# Patient Record
Sex: Female | Born: 1980 | Race: Black or African American | Hispanic: No | Marital: Married
Health system: Southern US, Community
[De-identification: ages and names within clinical notes are randomized; demographics above are authoritative.]

---

## 2005-07-21 ENCOUNTER — Encounter: Admission: RE | Admit: 2005-07-21 | Discharge: 2005-07-21 | Payer: Self-pay | Admitting: Family Medicine

## 2006-04-19 ENCOUNTER — Encounter: Admission: RE | Admit: 2006-04-19 | Discharge: 2006-04-19 | Payer: Self-pay | Admitting: Family Medicine

## 2006-04-19 ENCOUNTER — Ambulatory Visit: Payer: Self-pay | Admitting: Family Medicine

## 2006-07-03 ENCOUNTER — Ambulatory Visit: Payer: Self-pay | Admitting: Family Medicine

## 2006-07-13 ENCOUNTER — Ambulatory Visit: Payer: Self-pay | Admitting: Family Medicine

## 2006-07-13 ENCOUNTER — Other Ambulatory Visit: Admission: RE | Admit: 2006-07-13 | Discharge: 2006-07-13 | Payer: Self-pay | Admitting: Family Medicine

## 2007-06-14 ENCOUNTER — Ambulatory Visit: Payer: Self-pay | Admitting: Family Medicine

## 2007-07-05 ENCOUNTER — Ambulatory Visit: Payer: Self-pay | Admitting: Family Medicine

## 2007-07-17 ENCOUNTER — Other Ambulatory Visit: Admission: RE | Admit: 2007-07-17 | Discharge: 2007-07-17 | Payer: Self-pay | Admitting: Family Medicine

## 2007-07-17 ENCOUNTER — Ambulatory Visit: Payer: Self-pay | Admitting: Family Medicine

## 2007-09-03 ENCOUNTER — Ambulatory Visit: Payer: Self-pay | Admitting: Family Medicine

## 2007-10-01 ENCOUNTER — Ambulatory Visit: Payer: Self-pay | Admitting: Family Medicine

## 2007-10-17 ENCOUNTER — Ambulatory Visit: Payer: Self-pay | Admitting: Hematology and Oncology

## 2007-10-25 ENCOUNTER — Encounter: Payer: Self-pay | Admitting: Hematology and Oncology

## 2007-10-25 ENCOUNTER — Other Ambulatory Visit: Admission: RE | Admit: 2007-10-25 | Discharge: 2007-10-25 | Payer: Self-pay | Admitting: Hematology and Oncology

## 2007-10-25 LAB — CBC WITH DIFFERENTIAL/PLATELET
BASO%: 0.1 % (ref 0.0–2.0)
Basophils Absolute: 0 10*3/uL (ref 0.0–0.1)
EOS%: 0.1 % (ref 0.0–7.0)
Eosinophils Absolute: 0 10*3/uL (ref 0.0–0.5)
HCT: 35.5 % (ref 34.8–46.6)
HGB: 12.2 g/dL (ref 11.6–15.9)
LYMPH%: 10.1 % — ABNORMAL LOW (ref 14.0–48.0)
MCH: 27.8 pg (ref 26.0–34.0)
MCHC: 34.4 g/dL (ref 32.0–36.0)
MCV: 80.9 fL — ABNORMAL LOW (ref 81.0–101.0)
MONO#: 0.5 10*3/uL (ref 0.1–0.9)
MONO%: 2.5 % (ref 0.0–13.0)
NEUT#: 17.5 10*3/uL — ABNORMAL HIGH (ref 1.5–6.5)
NEUT%: 87.2 % — ABNORMAL HIGH (ref 39.6–76.8)
Platelets: 112 10*3/uL — ABNORMAL LOW (ref 145–400)
RBC: 4.39 10*6/uL (ref 3.70–5.32)
RDW: 21 % — ABNORMAL HIGH (ref 11.3–14.5)
WBC: 20.1 10*3/uL — ABNORMAL HIGH (ref 3.9–10.0)
lymph#: 2 10*3/uL (ref 0.9–3.3)

## 2007-10-25 LAB — COMPREHENSIVE METABOLIC PANEL
Albumin: 3.8 g/dL (ref 3.5–5.2)
CO2: 22 mEq/L (ref 19–32)
Chloride: 102 mEq/L (ref 96–112)
Glucose, Bld: 124 mg/dL — ABNORMAL HIGH (ref 70–99)
Potassium: 3.5 mEq/L (ref 3.5–5.3)
Sodium: 136 mEq/L (ref 135–145)
Total Protein: 6.9 g/dL (ref 6.0–8.3)

## 2007-10-25 LAB — LACTATE DEHYDROGENASE: LDH: 150 U/L (ref 94–250)

## 2007-10-26 ENCOUNTER — Ambulatory Visit (HOSPITAL_COMMUNITY): Admission: RE | Admit: 2007-10-26 | Discharge: 2007-10-26 | Payer: Self-pay | Admitting: Hematology and Oncology

## 2007-10-29 LAB — FLOW CYTOMETRY

## 2007-11-16 LAB — CBC WITH DIFFERENTIAL/PLATELET
BASO%: 0.2 % (ref 0.0–2.0)
Basophils Absolute: 0 10*3/uL (ref 0.0–0.1)
EOS%: 0.3 % (ref 0.0–7.0)
Eosinophils Absolute: 0 10*3/uL (ref 0.0–0.5)
HCT: 37.2 % (ref 34.8–46.6)
HGB: 12.8 g/dL (ref 11.6–15.9)
LYMPH%: 13.6 % — ABNORMAL LOW (ref 14.0–48.0)
MCH: 28.4 pg (ref 26.0–34.0)
MCHC: 34.4 g/dL (ref 32.0–36.0)
MCV: 82.4 fL (ref 81.0–101.0)
MONO#: 0.4 10*3/uL (ref 0.1–0.9)
MONO%: 2.5 % (ref 0.0–13.0)
NEUT#: 13.7 10*3/uL — ABNORMAL HIGH (ref 1.5–6.5)
NEUT%: 83.4 % — ABNORMAL HIGH (ref 39.6–76.8)
Platelets: 86 10*3/uL — ABNORMAL LOW (ref 145–400)
RBC: 4.51 10*6/uL (ref 3.70–5.32)
RDW: 18.3 % — ABNORMAL HIGH (ref 11.3–14.5)
WBC: 16.5 10*3/uL — ABNORMAL HIGH (ref 3.9–10.0)
lymph#: 2.2 10*3/uL (ref 0.9–3.3)

## 2007-11-16 LAB — COMPREHENSIVE METABOLIC PANEL
ALT: 10 U/L (ref 0–35)
AST: 15 U/L (ref 0–37)
Albumin: 3.9 g/dL (ref 3.5–5.2)
Alkaline Phosphatase: 51 U/L (ref 39–117)
BUN: 8 mg/dL (ref 6–23)
CO2: 22 mEq/L (ref 19–32)
Calcium: 8.9 mg/dL (ref 8.4–10.5)
Chloride: 103 mEq/L (ref 96–112)
Creatinine, Ser: 0.76 mg/dL (ref 0.40–1.20)
Glucose, Bld: 114 mg/dL — ABNORMAL HIGH (ref 70–99)
Potassium: 3.6 mEq/L (ref 3.5–5.3)
Sodium: 137 mEq/L (ref 135–145)
Total Bilirubin: 0.4 mg/dL (ref 0.3–1.2)
Total Protein: 7.1 g/dL (ref 6.0–8.3)

## 2007-11-29 LAB — CBC WITH DIFFERENTIAL/PLATELET
BASO%: 0 % (ref 0.0–2.0)
Basophils Absolute: 0 10*3/uL (ref 0.0–0.1)
EOS%: 0.3 % (ref 0.0–7.0)
Eosinophils Absolute: 0 10*3/uL (ref 0.0–0.5)
HCT: 34.9 % (ref 34.8–46.6)
HGB: 12.2 g/dL (ref 11.6–15.9)
LYMPH%: 14.1 % (ref 14.0–48.0)
MCH: 29.4 pg (ref 26.0–34.0)
MCHC: 34.9 g/dL (ref 32.0–36.0)
MCV: 84.2 fL (ref 81.0–101.0)
MONO#: 0.7 10*3/uL (ref 0.1–0.9)
MONO%: 4.8 % (ref 0.0–13.0)
NEUT#: 12.1 10*3/uL — ABNORMAL HIGH (ref 1.5–6.5)
NEUT%: 80.8 % — ABNORMAL HIGH (ref 39.6–76.8)
Platelets: 87 10*3/uL — ABNORMAL LOW (ref 145–400)
RBC: 4.15 10*6/uL (ref 3.70–5.32)
RDW: 16.8 % — ABNORMAL HIGH (ref 11.3–14.5)
WBC: 14.9 10*3/uL — ABNORMAL HIGH (ref 3.9–10.0)
lymph#: 2.1 10*3/uL (ref 0.9–3.3)

## 2007-12-10 LAB — BCR/ABL

## 2007-12-14 ENCOUNTER — Inpatient Hospital Stay (HOSPITAL_COMMUNITY): Admission: AD | Admit: 2007-12-14 | Discharge: 2007-12-14 | Payer: Self-pay | Admitting: Obstetrics and Gynecology

## 2007-12-18 ENCOUNTER — Ambulatory Visit: Payer: Self-pay | Admitting: Hematology and Oncology

## 2007-12-24 ENCOUNTER — Encounter: Admission: RE | Admit: 2007-12-24 | Discharge: 2007-12-24 | Payer: Self-pay | Admitting: Obstetrics and Gynecology

## 2008-03-13 ENCOUNTER — Inpatient Hospital Stay (HOSPITAL_COMMUNITY): Admission: AD | Admit: 2008-03-13 | Discharge: 2008-03-13 | Payer: Self-pay | Admitting: Obstetrics and Gynecology

## 2008-03-14 ENCOUNTER — Inpatient Hospital Stay (HOSPITAL_COMMUNITY): Admission: AD | Admit: 2008-03-14 | Discharge: 2008-03-14 | Payer: Self-pay | Admitting: Obstetrics and Gynecology

## 2008-03-18 ENCOUNTER — Inpatient Hospital Stay (HOSPITAL_COMMUNITY): Admission: AD | Admit: 2008-03-18 | Discharge: 2008-03-20 | Payer: Self-pay | Admitting: Obstetrics and Gynecology

## 2008-04-24 ENCOUNTER — Inpatient Hospital Stay (HOSPITAL_COMMUNITY): Admission: AD | Admit: 2008-04-24 | Discharge: 2008-04-27 | Payer: Self-pay | Admitting: Obstetrics and Gynecology

## 2008-08-18 ENCOUNTER — Ambulatory Visit: Payer: Self-pay | Admitting: Hematology and Oncology

## 2008-08-20 LAB — COMPREHENSIVE METABOLIC PANEL
ALT: 14 U/L (ref 0–35)
AST: 15 U/L (ref 0–37)
Albumin: 4.3 g/dL (ref 3.5–5.2)
Alkaline Phosphatase: 53 U/L (ref 39–117)
BUN: 15 mg/dL (ref 6–23)
CO2: 26 mEq/L (ref 19–32)
Calcium: 9.7 mg/dL (ref 8.4–10.5)
Chloride: 104 mEq/L (ref 96–112)
Creatinine, Ser: 1.01 mg/dL (ref 0.40–1.20)
Glucose, Bld: 80 mg/dL (ref 70–99)
Potassium: 3.7 mEq/L (ref 3.5–5.3)
Sodium: 137 mEq/L (ref 135–145)
Total Bilirubin: 0.5 mg/dL (ref 0.3–1.2)
Total Protein: 6.8 g/dL (ref 6.0–8.3)

## 2008-08-20 LAB — CBC WITH DIFFERENTIAL/PLATELET
BASO%: 0.8 % (ref 0.0–2.0)
Basophils Absolute: 0 10*3/uL (ref 0.0–0.1)
EOS%: 1.2 % (ref 0.0–7.0)
Eosinophils Absolute: 0.1 10*3/uL (ref 0.0–0.5)
HCT: 40 % (ref 34.8–46.6)
HGB: 13.8 g/dL (ref 11.6–15.9)
LYMPH%: 44.5 % (ref 14.0–48.0)
MCH: 29.4 pg (ref 26.0–34.0)
MCHC: 34.5 g/dL (ref 32.0–36.0)
MCV: 85.1 fL (ref 81.0–101.0)
MONO#: 0.4 10*3/uL (ref 0.1–0.9)
MONO%: 6.5 % (ref 0.0–13.0)
NEUT#: 2.7 10*3/uL (ref 1.5–6.5)
NEUT%: 47 % (ref 39.6–76.8)
Platelets: 95 10*3/uL — ABNORMAL LOW (ref 145–400)
RBC: 4.7 10*6/uL (ref 3.70–5.32)
RDW: 13.3 % (ref 11.3–14.5)
WBC: 5.7 10*3/uL (ref 3.9–10.0)
lymph#: 2.5 10*3/uL (ref 0.9–3.3)

## 2008-09-02 ENCOUNTER — Ambulatory Visit (HOSPITAL_COMMUNITY): Admission: RE | Admit: 2008-09-02 | Discharge: 2008-09-02 | Payer: Self-pay | Admitting: Hematology and Oncology

## 2008-09-02 ENCOUNTER — Encounter (INDEPENDENT_AMBULATORY_CARE_PROVIDER_SITE_OTHER): Payer: Self-pay | Admitting: Interventional Radiology

## 2008-09-15 ENCOUNTER — Encounter: Admission: RE | Admit: 2008-09-15 | Discharge: 2008-09-15 | Payer: Self-pay | Admitting: Obstetrics and Gynecology

## 2008-09-23 LAB — CBC WITH DIFFERENTIAL/PLATELET
BASO%: 0.5 % (ref 0.0–2.0)
Basophils Absolute: 0 10*3/uL (ref 0.0–0.1)
EOS%: 1 % (ref 0.0–7.0)
Eosinophils Absolute: 0.1 10*3/uL (ref 0.0–0.5)
HCT: 40.2 % (ref 34.8–46.6)
HGB: 13.8 g/dL (ref 11.6–15.9)
LYMPH%: 41.8 % (ref 14.0–48.0)
MCH: 29.4 pg (ref 26.0–34.0)
MCHC: 34.3 g/dL (ref 32.0–36.0)
MCV: 85.8 fL (ref 81.0–101.0)
MONO#: 0.4 10*3/uL (ref 0.1–0.9)
MONO%: 5.9 % (ref 0.0–13.0)
NEUT#: 3 10*3/uL (ref 1.5–6.5)
NEUT%: 50.8 % (ref 39.6–76.8)
Platelets: 79 10*3/uL — ABNORMAL LOW (ref 145–400)
RBC: 4.69 10*6/uL (ref 3.70–5.32)
RDW: 13.3 % (ref 11.3–14.5)
WBC: 5.9 10*3/uL (ref 3.9–10.0)
lymph#: 2.5 10*3/uL (ref 0.9–3.3)

## 2008-11-18 ENCOUNTER — Ambulatory Visit: Payer: Self-pay | Admitting: Hematology and Oncology

## 2008-12-01 ENCOUNTER — Ambulatory Visit (HOSPITAL_COMMUNITY): Admission: RE | Admit: 2008-12-01 | Discharge: 2008-12-01 | Payer: Self-pay | Admitting: General Surgery

## 2008-12-01 ENCOUNTER — Encounter (INDEPENDENT_AMBULATORY_CARE_PROVIDER_SITE_OTHER): Payer: Self-pay | Admitting: General Surgery

## 2009-03-03 ENCOUNTER — Ambulatory Visit: Payer: Self-pay | Admitting: Hematology and Oncology

## 2009-03-15 IMAGING — CT CT BIOPSY
2 of 6 series · 11 of 22 positions shown, 15 images · non-contrast
Comparison: none

CLINICAL DATA: Thrombocytopenia.  The patient requires bone marrow
biopsy and has requested conscious sedation.

[Series 3: bone windows 70 · axial · 0.89mm/px · z∈[-166,-116]mm · 8 of 13 slices shown]
[im 2/13  bone]
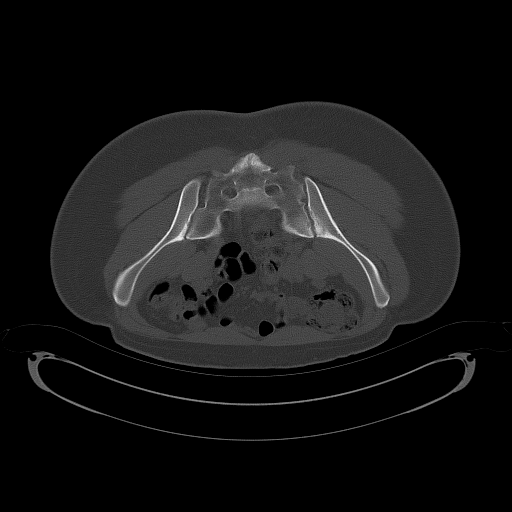
[im 4/13  bone]
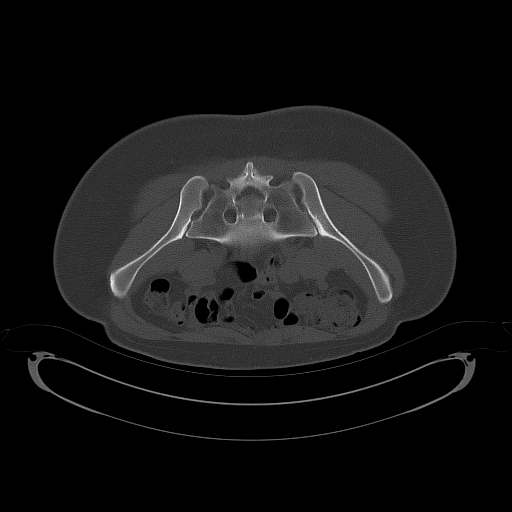
[im 5/13  bone]
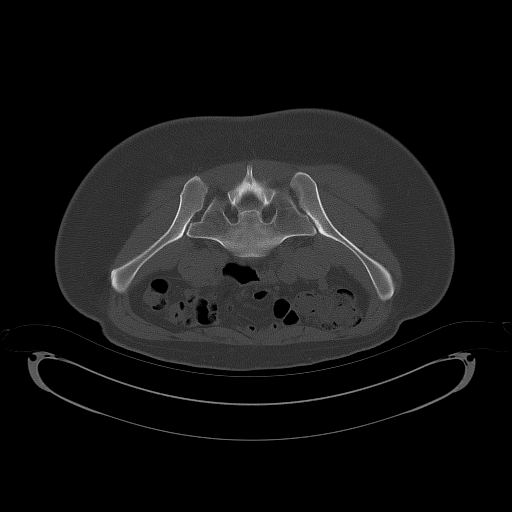
[im 6/13  bone]
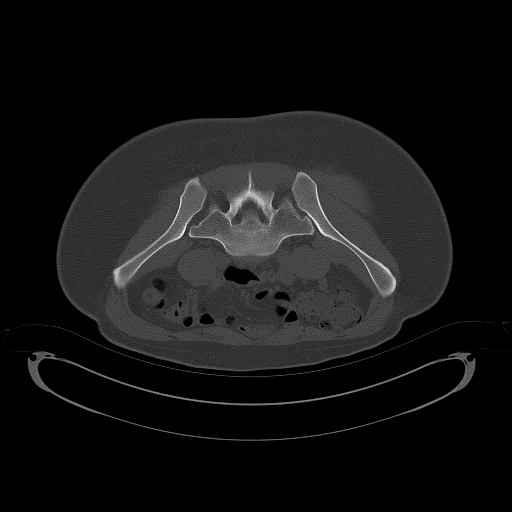
[im 8/13  bone]
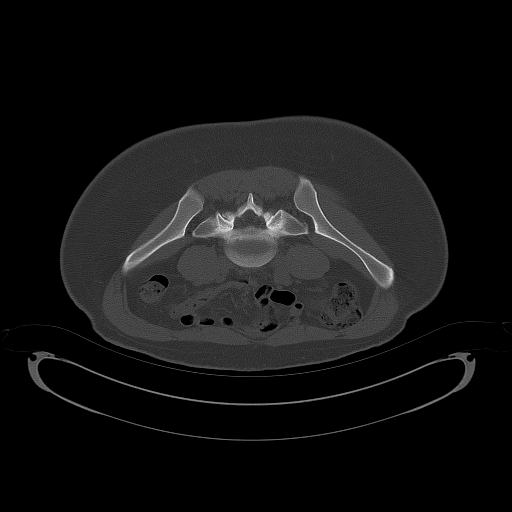
[im 9/13  bone]
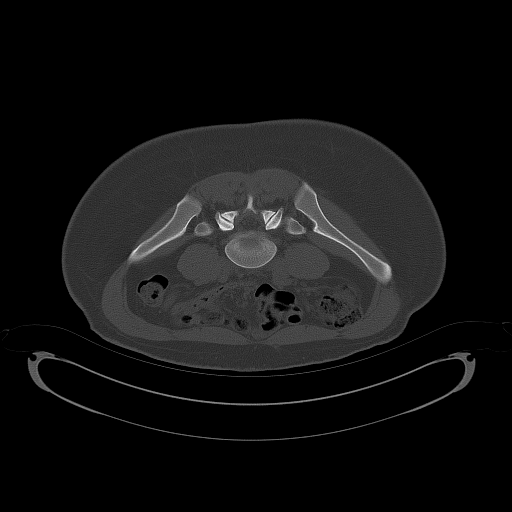
[im 10/13  bone]
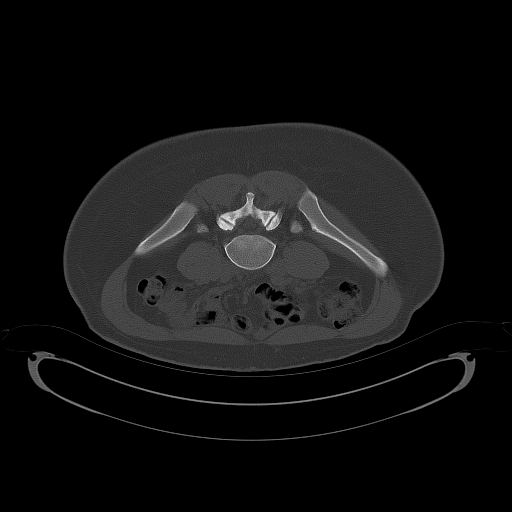
[im 12/13  bone]
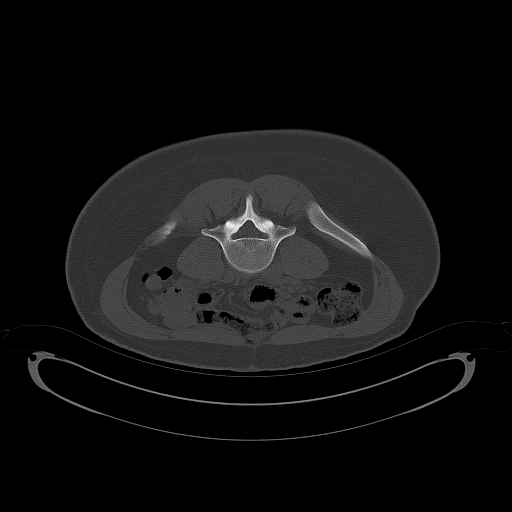

[Series 7: add scan 2.0 b70f · axial · 0.89mm/px · z∈[-154,-150]mm · 3 of 3 slices shown, 7 images]
[im 1/3  soft-tissue]
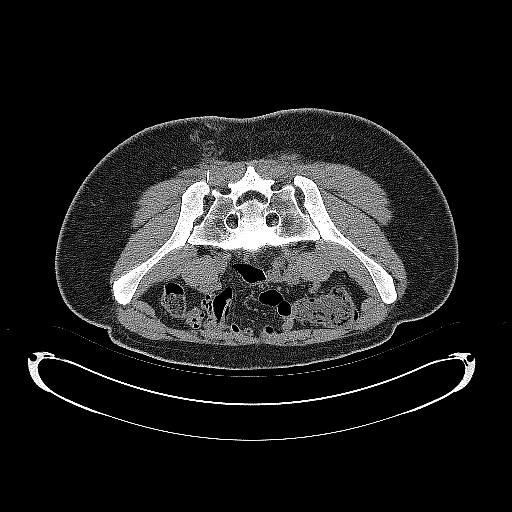
[im 1/3  lung]
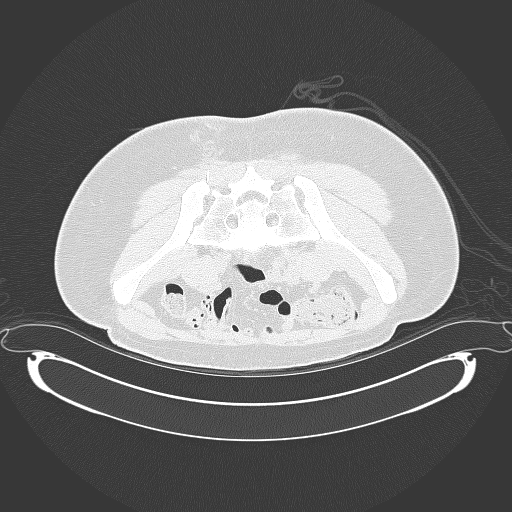
[im 1/3  bone]
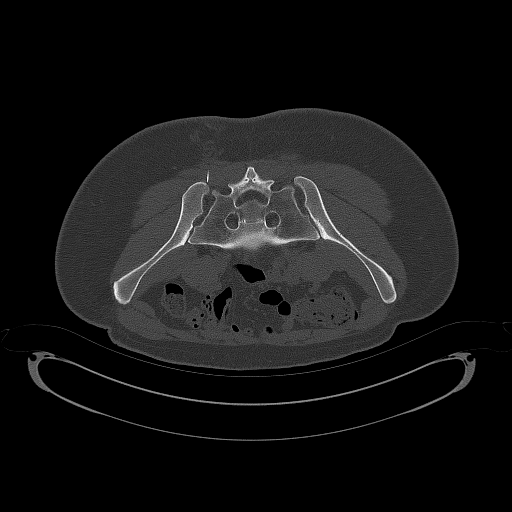
[im 2/3  soft-tissue]
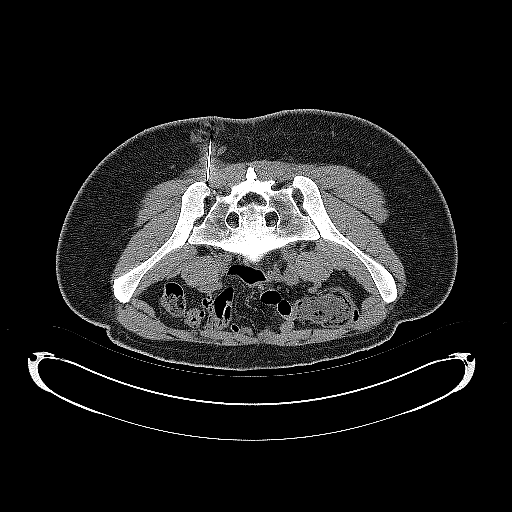
[im 2/3  lung]
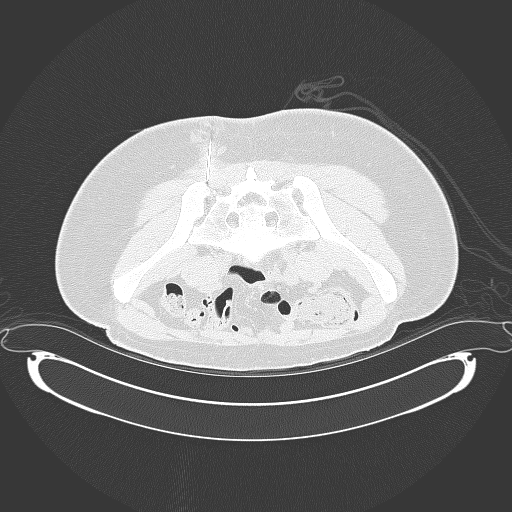
[im 3/3  soft-tissue]
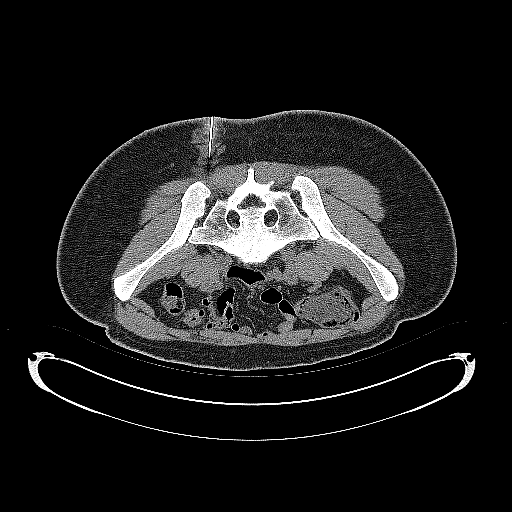
[im 3/3  lung]
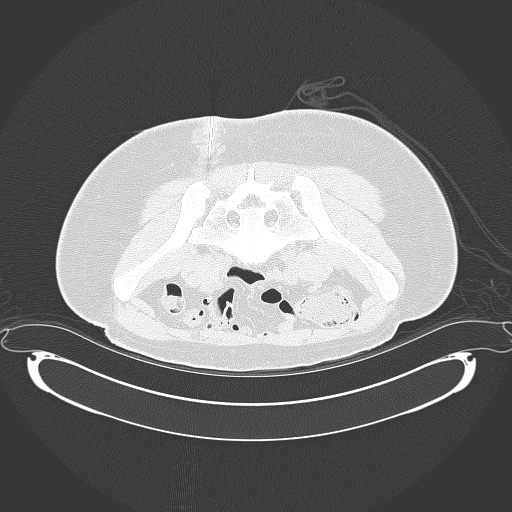

[11 of 22 positions shown; findings below may reference images not displayed]

CT GUIDED ASPIRATE AND CORE BIOPSY OF LEFT ILIAC BONE MARROW

Sedation: Versed 2.0 mg IV, Fentanyl 100 mcg IV

Total Moderate Sedation Time: 15 minutes.

Procedure:  The procedure risks, benefits, and alternatives were
explained to the patient.  Questions regarding the procedure were
encouraged and answered.  The patient understands and consents to
the procedure.

The left gluteal region was prepped with betadine in a sterile
fashion, and a sterile drape was applied covering the operative
field.  A sterile gown and sterile gloves were used for the
procedure.  Local anesthesia was provided with 1% Lidocaine.

CT was used to localize the bony pelvis with the the patient placed
in a prone position.  After marking the skin, an 11 gauge bone
cutting needle was advanced into the posterior left iliac bone.
After confirming needle tip position, initial non heparinized and
heparinized aspirates of marrow were performed.

A coaxial 14-gauge core needle was then utilized in obtaining three
separate core biopsy samples.  The outer needle was then removed.

Complications: None
FINDINGS: Satisfactory positioning of the needle was accomplished
with adequate samples obtained.  Material was submitted for
analysis.
IMPRESSION: CT guided bone marrow biopsy of left posterior iliac bone.

## 2009-08-25 ENCOUNTER — Encounter: Admission: RE | Admit: 2009-08-25 | Discharge: 2009-08-25 | Payer: Self-pay | Admitting: Obstetrics and Gynecology

## 2009-12-28 ENCOUNTER — Ambulatory Visit: Payer: Self-pay | Admitting: Family Medicine

## 2010-11-01 ENCOUNTER — Ambulatory Visit
Admission: RE | Admit: 2010-11-01 | Discharge: 2010-11-01 | Payer: Self-pay | Source: Home / Self Care | Attending: Family Medicine | Admitting: Family Medicine

## 2011-01-25 LAB — CBC
HCT: 44.9 % (ref 36.0–46.0)
Hemoglobin: 14.9 g/dL (ref 12.0–15.0)
MCHC: 33.1 g/dL (ref 30.0–36.0)
MCV: 89.8 fL (ref 78.0–100.0)
Platelets: 206 10*3/uL (ref 150–400)
RBC: 5 MIL/uL (ref 3.87–5.11)
RDW: 13.2 % (ref 11.5–15.5)
WBC: 20.5 10*3/uL — ABNORMAL HIGH (ref 4.0–10.5)

## 2011-01-25 LAB — PREGNANCY, URINE: Preg Test, Ur: NEGATIVE

## 2011-01-25 LAB — ABO/RH: ABO/RH(D): O POS

## 2011-02-22 NOTE — H&P (Signed)
NAMECACHE, DECOURSEY NO.:  [id number]   MEDICAL RECORD NO.:  [id number]          PATIENT TYPE:  INP   LOCATION:  9160                          [facility]   PHYSICIAN:  Osborn Coho, M.D.   DATE OF BIRTH:  02-Jul-1981   DATE OF ADMISSION:  03/18/2008  DATE OF DISCHARGE:                              HISTORY & PHYSICAL   HISTORY OF PRESENT ILLNESS:  Stacy Melendez is a 30 year old married black  female gravida 2, para 1-0-0-1 at 32-3/7 weeks who presents for  hospitalization secondary to oligohydramnios, which was diagnosed at the  [facility] today.  Her AFI was 7.3 cm which is less  than the 3rd percentile.  She denies any pain, bleeding, or leaking  currently.  She had an evaluation of preterm contractions last week with  a negative fetal fibronectin on March 13, 2008, and her cervix at that  time was 1 cm and 70% effaced.  She received the betamethasone.  Her  pregnancy has been followed by the [facility],  has been remarkable for:  1. Irregular cycles.  2. History of STDs.  3. Acid reflux.  4. History of TB, and she was treated.  5. History of macrosomic infant.  6. Preterm cervical change with negative fetal fibronectin.  7. Thrombocytopenia, which has been followed by Dr. Dalene Carrow and      Hematology.   PRENATAL LABS:  Her prenatal labs were collected on September 12, 2007,  hemoglobin 12.0, hematocrit 41.5, and platelets 98,000.  Blood type O  positive, antibody negative, sickle cell trait negative, RPR  nonreactive, rubella immune, hepatitis B surface antigen negative, and  HIV nonreactive.  Pap smear was normal from October 2008.   HISTORY OF PRESENT PREGNANCY:  The patient presented for care at [facility] in December 2008, at 90-4/[redacted] weeks gestation.  Ultrasound was  done on that day giving the [facility] of May 10, 2008.  Her platelets were  98 at her new OB visit, and her white blood cell count was 16.1.   She  was referred to Hematology and was evaluated by Dr. Dalene Carrow on October 25, 2007, and would continue to have her platelets evaluated monthly.  She had a normal first trimester screen.  She was treated for sinus  infection at 17 weeks' gestation.  Anatomy ultrasound at 90 weeks'  gestation shows growth consistent with previous dating.  She was feeling  some pressure as well at 19 weeks' gestation.  Her evaluation was  negative, platelets on October 10, 2007, were 98,000, and on October 25, 2007, they are 112,000, and on December 14, 2007, they were 79,000.  She  had an evaluation by an ENT for a right submandibular lymph node that  was swollen and that evaluation was negative.  Her 1-hour Glucola was  108, and the rest of her prenatal care was unremarkable up until her  preterm contractions last week.  Per OB history, she is a gravida 2,  para 1-0-0-1.  In November 2004, she had a vaginal delivery of a female  infant weighing 9  pounds 4 ounces at 40 weeks' gestation after 11 hours  in labor.  She had an epidural for anesthesia.  Infant's name is Chico,  and there were no complications with that pregnancy at birth and he was  born in [place].  The second pregnancy is the current pregnancy.   PAST MEDICAL HISTORY:  Past medical history shows no medication  allergies.  She experienced menarche at the age of 56 with cycles every  3-4 weeks.  She has used birth control pills and condoms in the past for  contraception.  She has had Chlamydia in the past.  She reports having  the usual childhood illnesses.  She was treated for TB in 2005, with  negative screening in 2006, this was when she was in the [employer].  The  patient has acid reflux.   PAST SURGICAL HISTORY:  Negative.   FAMILY MEDICAL HISTORY:  Sister with anemia.  Maternal grandmother is  deceased from HIV.  Sister with depression.   GENETIC HISTORY:  Negative.   SOCIAL HISTORY:  The patient is married to the father of the baby.   He  is involved and supportive.  His name is Chico.  They are of the  [place] faith.  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  Father of the baby has a degree and is a Proofreader.  She denied any alcohol, tobacco, or illicit drug use with the  pregnancy.   OBJECTIVE:  VITAL SIGNS:  Stable.  She is afebrile.  HEENT:  Grossly within normal limits.  CHEST:  Clear to auscultation.  HEART:  Regular rate and rhythm.  ABDOMEN:  Gravid in contour with fundal height extending approximately  32 cm above pubic symphysis.  Fetal heart rate is reactive and  reassuring.  Contractions initially were 4-8 minutes upon arrival, but  now very sporadic after two doses of Procardia 10 mg.  CERVIX:  In the office today was 1 cm, 70% effaced.   LABORATORY DATA:  CBC today showed platelets at 136,000, and white blood  cell count of 21.3.   ASSESSMENT:  1. Intrauterine pregnancy at 32-3/7 weeks.  2. Oligohydramnios.  3. Preterm cervical change.  4. Thrombocytopenia that is stable.  5. Elevated white blood cell count.   PLAN:  To admit for IV fluids and bedrest and to do a repeat ultrasound  to evaluate for oligohydramnios.      Cam Hai, C.N.M.      Osborn Coho, M.D.  Electronically Signed    KS/MEDQ  D:  03/18/2008  T:  03/19/2008  Job:  [id number]

## 2011-02-22 NOTE — H&P (Signed)
NAMEDWAYNE, Stacy Melendez            ACCOUNT NO.:  [id number]   MEDICAL RECORD NO.:  [id number]          PATIENT TYPE:  INP   LOCATION:  9168                          [facility]ILITY[facility]  [facility]   PHYSICIAN:  Crist Fat. Rivard, M.D. DATE OF BIRTH:  11-12-1980   DATE OF ADMISSION:  04/24/2008  DATE OF DISCHARGE:                              HISTORY & PHYSICAL   CHIEF COMPLAINT AND HISTORY OF THE PRESENT ILLNESS:  The patient is a 30-  year-old gravida 2, para 1-0-0-1 at 38 weeks who presents today with  uterine contractions every 3-5 minutes for the last several hours.  She  denies any leaking or bleeding, and reports positive fetal movement.  Her history is remarkable for:  1. Thrombocytopenia identified first at her new OB visit.  Platelets      at that time were 98,000 and they went down to a nadir of 58,000 at      35.5 weeks; and, as of last week they were 141,000.  She has been      on prednisone.  2. History of a macrosomic infant.  3. History of TB that was treated in the past.  4. History of STDs.  5. Irregular cycles.  6. Group B Strep negative.  7. Preterm labor this pregnancy, but delivering at term.   PRENATAL LABORATORY DATA:  The prenatal labs revealed the following:  Blood type is O+, Rh antibody negative.  VDRL nonreactive.  Rubella  titer positive.  Hepatitis B surface antigen negative.  HIV was  nonreactive.  Sickle cell test was negative.  GC and chlamydia cultures  were negative in the first trimester.  Pap was normal in October 2008.  Platelets were 98,000 at her new OB visit.  Hemoglobin was 12 and her  platelets were 112,000 at 13 weeks.  On March they were 79,000.  She had  some spotting at 19 weeks.  She also had an evaluation for cramping and  her cervix was fairly friable at that time.  GC and Chlamydia cultures  were negative at that time.  She had an ultrasound at 18 weeks showing  normal growth at this time.  She had a previous ultrasound at 9 weeks,  which gave an  [facility] of May 10, 2008.  Platelets remained stable at 22  weeks with a level of 78,000.  Glucola was normal.  She had a normal  TSH.  The cervix was 1+ at 50%  with bulging membranes.   At 32 weeks she had another ultrasound showing growth at the 87th  percentile.  She did have low fluid and she was sent to [facility]  for IV fluids.  Subsequent to that admission she was on Procardia 10 mg  every 6 hours.  She had normal fluid on March 20, 2008.  She had a repeat  ultrasound at 35 weeks showing normal growth and normal fluid.  Fetal  fibronectin was done at 34 weeks when she was having more contractions  and the cervix was unchanged.   On April 09, 2008 she had a platelet count done that was a 58,000.  Hematology was  consulted Dr. Dalene Carrow and he recommended prednisone 60 mg  by mouth every day and the platelets would be rechecked in 1 week if  they were greater than or equal to 60,000, and prednisone would have  been taken down to 30.  Platelets were checked a week later and  prednisone was decreased to 20 mg; and, this is dose she has been  taking.  She had a platelet count last week of 141,000 and she had  another platelet count done in the office today, those results are still  pending.  She was scheduled for induction on May 02, 2008 with Dr.  Pennie Rushing.   OBSTETRICAL HISTORY:  In 2004 she had a vaginal birth of a female infant  weighing 9 pounds 4 ounces at 40 weeks.  She was in labor 11 hours and  she had no complications.   PAST MEDICAL HISTORY:  1. The patient is a previous birth control pill and condom user.  2. The patient's Pap in October 2008 was normal at Dr. __________      office.  3. Chlamydia was treated in the past.  4. The patient has had occasional yeast infections.  5. The patient was treated for TB in 2005 when she was in [place] in the      [employer].  6. The patient does have some acid reflux.   ALLERGIES:  The patient has no allergies.   FAMILY HISTORY:   The patient's sister has anemia.  Maternal grandmother  had HIV is now deceased.  Her sister had depression.  Her sister was a  smoker.  Mom's maternal uncle, sisters and brothers all have a history  of drug use and nicotine use.  A maternal aunt also used drugs.   GENETIC HISTORY:  The genetic history is unremarkable.   SOCIAL HISTORY:  The patient is married to the father of baby.  He is  involved and supportive.  His name is [employer]riott.  The patient is  [place] and is of the [place] faith.  She has a bachelor's  degree.  She is an Print production planner.  Her husband has an associate's  degree and he is an Teacher, adult education.  She is has been followed by the  Physician Service at [facility].  She denies any alcohol, drug  or tobacco use during this pregnancy.   PHYSICAL EXAMINATION:  VITAL SIGNS:  The vital signs are stable.  The  patient is afebrile.  HEENT: The head, eyes, ears, nose and throat are within normal limits.  LUNGS:  The patient's breath sounds are clear.  HEART:  The heart has a regular rate and rhythm without murmur.  BREASTS:  The breasts are soft and nontender.  ABDOMEN:  The abdomen shows the fundal height is approximately 38 cm.  Estimated fetal weight is 8-8.5 pounds.  Uterine contractions are every  2-4 minutes, moderate quality.  Cervix is 6 cm, posterior, 60%, vertex,  and a -2 station with intact bag of waters.  Fetal heart rate is  reactive and baseline is 155-165 and accelerations are noted.  There are  no decelerations noted.  EXTREMITIES:  The extremities show deep tendon reflexes are 2+ without  clonus.  There is trace edema noted.   IMPRESSION:  1. Intrauterine pregnancy at 38 weeks.  2. History of thrombocytopenia this pregnancy with patient currently      on prednisone 20 mg.  3. Group B Streptococcus negative.   PLAN:  1. Admit to birthing suite  per consult with Dr. Silverio Lay as the      attending physician.  2. Routine physician  orders.  3. CBC will be checked for routine admit labs.  4. The patient may desire an epidural, but she is currently undecided.      Renaldo Reel Emilee Hero, C.N.M.      Crist Fat Rivard, M.D.  Electronically Signed    VLL/MEDQ  D:  04/24/2008  T:  04/25/2008  Job:  [id number]

## 2011-02-22 NOTE — Op Note (Signed)
NAMEATLANTA, PELTO NO.:  [id number]   MEDICAL RECORD NO.:  [id number]          PATIENT TYPE:  AMB   LOCATION:  DAY                          FACILITY[facility]  [facility]   PHYSICIAN:  Lennie Muckle, MD      DATE OF BIRTH:  12-21-1980   DATE OF PROCEDURE:  12/01/2008  DATE OF DISCHARGE:  12/01/2008                               OPERATIVE REPORT   PREOPERATIVE DIAGNOSIS:  Left breast mass, fibroadenoma.   POSTOPERATIVE DIAGNOSIS:  Left breast mass, fibroadenoma.   PROCEDURE:  Excision of left breast mass.   SURGEON:  Amber L. Freida Busman, M.D.   ASSISTANT:  None.   General endotracheal anesthesia.   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  Mild amount of blood loss.  No immediate  complications.  No drains.   INDICATIONS FOR PROCEDURE:  Ms. Stacy Melendez is a 30 year old female I had  seen due to a palpable left breast mass.  She had noticed this during  her pregnancy.  Imaging revealed no solid or cystic lesions; however,  she did have a hard lesion on palpation.  I did perform a biopsy with  the findings of a fibroadenoma.  She desired to go ahead and have full  excision of this area.  I had talked to Dr. Dalene Carrow due to her having a  history of ITP.  She received prednisone preoperatively with platelets  of 222.   DETAILS OF THE PROCEDURE:  Ms. [name] was identified in the  preoperative holding area.  After IV was placed, she was taken to the  operating room, and once in the operating room placed in supine  position.  After administration of general endotracheal anesthesia, her  left breast was prepped and draped in the usual sterile fashion.  A time-  out procedure indicating patient and procedure was performed.  I placed  an elliptical incision around the areola superiorly.  Created a skin  flap superiorly.  Using the electrocautery, I was able to fully mobilize  the skin in order to perform excision of the specimen.   I placed a towel  clip around the specimen.  I excised the specimen intact, oriented this  with a short single superior, 2 double short deep, and a long lateral.  This was passed off the operative field.  I irrigated the biopsy site.  A small amount of oozing was controlled with electrocautery.  I  reapproximated the breast tissue with 3-0 Vicryl suture.  I then  reapproximated the dermis with 3-0 Vicryl.  Skin was closed with 4-0  Monocryl.  Steri-Strip was placed as final dressing.  I did inject  approximately 30% of half Marcaine into the area.  The patient was  extubated and transported to postanesthesia care unit in stable  condition, and I will call her when her final pathology is available.  She will follow up with me in approximately 2-3 weeks.      Lennie Muckle, MD  Electronically Signed     ALA/MEDQ  D:  12/01/2008  T:  12/02/2008  Job:  [id number]  cc:   Sharlot Gowda, M.D.  Fax: [phone]   Hal Morales, M.D.  Fax: [phone]   Lauretta I. Odogwu, M.D.  Fax: [phone]

## 2011-02-22 NOTE — Discharge Summary (Signed)
NAMEJEFF, MCCALLUM            ACCOUNT NO.:  [id number]   MEDICAL RECORD NO.:  [id number]          PATIENT TYPE:  INP   LOCATION:  9160                          [facility]   PHYSICIAN:  Hal Morales, M.D.DATE OF BIRTH:  07/12/1981   DATE OF ADMISSION:  03/18/2008  DATE OF DISCHARGE:  03/20/2008                               DISCHARGE SUMMARY   ADMITTING DIAGNOSES:  1. Intrauterine pregnancy at 32-3/7 weeks.  2. Oligohydramnios.  3. Preterm cervical change.  4. Thrombocytopenia, stable.  5. Elevated white blood cell count.   DISCHARGE DIAGNOSES:  1. Intrauterine pregnancy at 32-4/7 weeks.  2. History of oligohydramnios, now resolved.  3. Preterm contractions, improved.   PROCEDURES:  IV hydration.   HOSPITAL COURSE:  Ms. Tomeo is a 30 year old gravida 2, para 1-0-0-1  at 32-3/7 weeks who was admitted on March 18, 2008, secondary to  oligohydramnios diagnosed at [facility] on the day of admission  with an AFI of 7.3, which represented less than the third percentile.  She had evaluation of preterm labor last week with a negative fetal  fibronectin on March 13, 2008.  Her cervix was 170%.  She had received the  betamethasone series, then presented today for an ultrasound, at which  time oligo was noted.  Cervix was also 1.7 cm noted on exam.  Pregnancy  had been remarkable for:  1. Irregular cycles.  2. History of STDs.  3. History of reflux disease.  4. History of TB treated.  5. History of macrosomic infant.  6. Preterm cervical change with negative fetal fibronectin.  7. Thrombocytopenia.   On admission, fetal heart rate was reactive.  Contractions were  initially q.4-8 minutes.  She received 2 doses of Procardia and that did  space some out quite a bit.  Her cervix in the office today had been  170%.  Her hemoglobin was 11.9, white blood cell count was 21.3, and  platelet count was 136.  She was admitted for IV fluid and bedrest.  Fetal heart rate  remained reassuring throughout the patient's  hospitalization.  She did have some very sporadic and very mild  variables.  On March 18, 2008, she had a repeat ultrasound showing a BPP  of 6/8 with -2 for breathing, an AFI of 5.3 which still represented less  than the third percentile, and a white blood cell count that was coming  down at 17.9.  Decision was made to continue her hospitalization and  reevaluate on March 20, 2008.  She had an ultrasound in the morning of  March 20, 2008, showing vertex presentation, AFI of 8.9 cm, which  represented an improvement to the 90th percentile which was within  normal limits and a BPP of 8/8.  Fetal heart rate was reactive.  There  were some occasional mild variables noted.  Uterine contractions running  from 0-3 per hour with some scattered irritability.  The contractions  tended to be spaced out significantly when her dose of Procardia 2 mg  q.6 hours was given, then she would start to contract a bit more when it  was time for  her next dose.  Dr. Pennie Rushing saw the patient in light of the  patient's improvement of resolution of oligo, diminishing in the white  blood cell count, in general improvement of the preterm contraction  status.  The patient was deemed to receive full benefit for hospital  stay and was discharged home.   DISCHARGE INSTRUCTIONS:  The patient is to be maintained on modified  bedrest at home.  Signs and symptoms of preterm labor were reviewed with  the patient.   DISCHARGE MEDICATIONS:  Procardia 10 mg p.o. q.6 hours, this was called  to [employer] [facility] on [address] at [employer]   FOLLOWUP:  Discharge followup will occur as scheduled in the office next  week or p.r.n.      Renaldo Reel Emilee Hero, C.N.M.      Hal Morales, M.D.  Electronically Signed    VLL/MEDQ  D:  03/20/2008  T:  03/21/2008  Job:  [id number]

## 2011-07-04 LAB — CBC
HCT: 34.8 — ABNORMAL LOW
Hemoglobin: 11.8 — ABNORMAL LOW
RBC: 4.02
RDW: 15.4

## 2011-07-04 LAB — URINALYSIS, ROUTINE W REFLEX MICROSCOPIC
Glucose, UA: NEGATIVE
Ketones, ur: NEGATIVE
Specific Gravity, Urine: 1.02
pH: 7

## 2011-07-07 LAB — CBC
HCT: 32.3 — ABNORMAL LOW
HCT: 34.1 — ABNORMAL LOW
Hemoglobin: 11.9 — ABNORMAL LOW
MCHC: 34.4
MCV: 91.7
MCV: 92.1
Platelets: 125 — ABNORMAL LOW
Platelets: 136 — ABNORMAL LOW
RBC: 3.7 — ABNORMAL LOW
RDW: 14.5
WBC: 17.9 — ABNORMAL HIGH
WBC: 21.3 — ABNORMAL HIGH

## 2011-07-07 LAB — DIFFERENTIAL
Basophils Relative: 0
Eosinophils Absolute: 0.1
Eosinophils Relative: 0
Lymphs Abs: 2
Neutrophils Relative %: 84 — ABNORMAL HIGH

## 2011-07-07 LAB — URINE CULTURE

## 2011-07-08 LAB — CBC
HCT: 38.3
Hemoglobin: 11.4 — ABNORMAL LOW
Hemoglobin: 12.9
MCHC: 33.2
MCV: 92.4
MCV: 93.8
RBC: 3.65 — ABNORMAL LOW
RDW: 14.1
RDW: 14.4
WBC: 17.1 — ABNORMAL HIGH

## 2011-07-12 LAB — CHROMOSOME ANALYSIS, BONE MARROW

## 2011-07-12 LAB — BONE MARROW EXAM

## 2011-07-12 LAB — CBC: RDW: 13.2 % (ref 11.5–15.5)

## 2011-07-12 LAB — PROTIME-INR: INR: 1 (ref 0.00–1.49)

## 2019-03-25 ENCOUNTER — Telehealth: Payer: Self-pay | Admitting: *Deleted

## 2019-03-25 NOTE — Telephone Encounter (Signed)
MR faxed to [facility] - [id number]
